# Patient Record
Sex: Male | Born: 1955 | Race: White | Hispanic: No | Marital: Married | Smoking: Never smoker
Health system: Southern US, Community
[De-identification: ages and names within clinical notes are randomized; demographics above are authoritative.]

## PROBLEM LIST (undated history)

## (undated) DIAGNOSIS — G8929 Other chronic pain: Secondary | ICD-10-CM

## (undated) DIAGNOSIS — M1990 Unspecified osteoarthritis, unspecified site: Secondary | ICD-10-CM

## (undated) DIAGNOSIS — M79673 Pain in unspecified foot: Secondary | ICD-10-CM

## (undated) HISTORY — PX: HERNIA REPAIR: SHX51

---

## 2013-06-29 ENCOUNTER — Other Ambulatory Visit: Payer: Self-pay | Admitting: Orthopedic Surgery

## 2013-06-29 DIAGNOSIS — M79671 Pain in right foot: Secondary | ICD-10-CM

## 2013-06-29 DIAGNOSIS — M79673 Pain in unspecified foot: Secondary | ICD-10-CM

## 2013-07-26 ENCOUNTER — Other Ambulatory Visit: Payer: Self-pay

## 2015-06-26 NOTE — H&P (Signed)
TOTAL HIP ADMISSION H&P  Patient is admitted for right total hip arthroplasty.  Subjective:  Chief Complaint: right hip pain  HPI: Minus Micheal Foster Level, 59 y.o. male, has a history of pain and functional disability in the right hip(s) due to arthritis and patient has failed non-surgical conservative treatments for greater than 12 weeks to include NSAID's and/or analgesics, corticosteriod injections, supervised PT with diminished ADL's post treatment, use of assistive devices and activity modification.  Onset of symptoms was gradual starting 2 years ago with gradually worsening course since that time.The patient noted no past surgery on the right hip(s).  Patient currently rates pain in the right hip at 9 out of 10 with activity. Patient has night pain, worsening of pain with activity and weight bearing, pain that interfers with activities of daily living and crepitus. Patient has evidence of joint space narrowing by imaging studies. This condition presents safety issues increasing the risk of falls.  There is no current active infection.  There are no active problems to display for this patient.  No past medical history on file.  No past surgical history on file.  No prescriptions prior to admission   Allergies not on file  Social History  Substance Use Topics  . Smoking status: Not on file  . Smokeless tobacco: Not on file  . Alcohol Use: Not on file    No family history on file.   Review of Systems  Constitutional: Negative for fever and chills.  HENT: Negative for congestion and sore throat.   Eyes: Negative for blurred vision.  Respiratory: Negative for shortness of breath and wheezing.   Cardiovascular: Negative for chest pain and palpitations.  Gastrointestinal: Negative for nausea, vomiting and abdominal pain.  Musculoskeletal:       Pain in the right hip with prolonged ambulation  Skin: Negative for rash.  Neurological: Negative for dizziness and loss of consciousness.   Psychiatric/Behavioral: Negative for depression and suicidal ideas.    Objective:  Physical Exam  Vitals reviewed. Constitutional: He is oriented to person, place, and time. He appears well-developed and well-nourished.  HENT:  Head: Normocephalic and atraumatic.  Eyes: Conjunctivae and EOM are normal. Pupils are equal, round, and reactive to light.  Neck: Normal range of motion. Neck supple.  Cardiovascular: Normal rate and intact distal pulses.   Respiratory: Effort normal and breath sounds normal.  GI: Soft. Bowel sounds are normal.  Musculoskeletal:  Pain with flexion of the right hip  Neurological: He is alert and oriented to person, place, and time.  Skin: Skin is warm and dry.  Psychiatric: He has a normal mood and affect. His behavior is normal. Judgment and thought content normal.    Vital signs in last 24 hours:    Labs:   There is no height or weight on file to calculate BMI.   Imaging Review Plain radiographs demonstrate severe degenerative joint disease of the right hip(s). The bone quality appears to be fair for age and reported activity level.  Assessment/Plan:  End stage arthritis, right hip(s)  The patient history, physical examination, clinical judgement of the provider and imaging studies are consistent with end stage degenerative joint disease of the right hip(s) and total hip arthroplasty is deemed medically necessary. The treatment options including medical management, injection therapy, arthroscopy and arthroplasty were discussed at length. The risks and benefits of total hip arthroplasty were presented and reviewed. The risks due to aseptic loosening, infection, stiffness, dislocation/subluxation,  thromboembolic complications and other imponderables were discussed.  The  patient acknowledged the explanation, agreed to proceed with the plan and consent was signed. Patient is being admitted for inpatient treatment for surgery, pain control, PT, OT,  prophylactic antibiotics, VTE prophylaxis, progressive ambulation and ADL's and discharge planning.The patient is planning to be discharged home with home health services

## 2015-07-07 ENCOUNTER — Encounter (HOSPITAL_COMMUNITY)
Admission: RE | Admit: 2015-07-07 | Discharge: 2015-07-07 | Disposition: A | Discharge status: Home / Self Care | Payer: BLUE CROSS/BLUE SHIELD | Source: Ambulatory Visit | Attending: Orthopedic Surgery | Admitting: Orthopedic Surgery

## 2015-07-07 ENCOUNTER — Encounter (HOSPITAL_COMMUNITY): Payer: Self-pay

## 2015-07-07 DIAGNOSIS — Z01812 Encounter for preprocedural laboratory examination: Secondary | ICD-10-CM | POA: Insufficient documentation

## 2015-07-07 DIAGNOSIS — Z0183 Encounter for blood typing: Secondary | ICD-10-CM | POA: Insufficient documentation

## 2015-07-07 DIAGNOSIS — M1611 Unilateral primary osteoarthritis, right hip: Secondary | ICD-10-CM | POA: Diagnosis not present

## 2015-07-07 HISTORY — DX: Other chronic pain: G89.29

## 2015-07-07 HISTORY — DX: Pain in unspecified foot: M79.673

## 2015-07-07 HISTORY — DX: Unspecified osteoarthritis, unspecified site: M19.90

## 2015-07-07 LAB — TYPE AND SCREEN
ABO/RH(D): A POS
Antibody Screen: NEGATIVE

## 2015-07-07 LAB — URINALYSIS, ROUTINE W REFLEX MICROSCOPIC
Bilirubin Urine: NEGATIVE
Glucose, UA: NEGATIVE mg/dL
Hgb urine dipstick: NEGATIVE
Ketones, ur: NEGATIVE mg/dL
Leukocytes, UA: NEGATIVE
Nitrite: NEGATIVE
Protein, ur: NEGATIVE mg/dL
Specific Gravity, Urine: 1.011 (ref 1.005–1.030)
Urobilinogen, UA: 0.2 mg/dL (ref 0.0–1.0)
pH: 6 (ref 5.0–8.0)

## 2015-07-07 LAB — CBC
HCT: 44 % (ref 39.0–52.0)
Hemoglobin: 14.6 g/dL (ref 13.0–17.0)
MCH: 31.7 pg (ref 26.0–34.0)
MCHC: 33.2 g/dL (ref 30.0–36.0)
MCV: 95.7 fL (ref 78.0–100.0)
Platelets: 182 10*3/uL (ref 150–400)
RBC: 4.6 MIL/uL (ref 4.22–5.81)
RDW: 13 % (ref 11.5–15.5)
WBC: 7.7 10*3/uL (ref 4.0–10.5)

## 2015-07-07 LAB — ABO/RH: ABO/RH(D): A POS

## 2015-07-07 LAB — BASIC METABOLIC PANEL
Anion gap: 9 (ref 5–15)
BUN: 13 mg/dL (ref 6–20)
CO2: 28 mmol/L (ref 22–32)
Calcium: 9 mg/dL (ref 8.9–10.3)
Chloride: 98 mmol/L — ABNORMAL LOW (ref 101–111)
Creatinine, Ser: 1.21 mg/dL (ref 0.61–1.24)
GFR calc Af Amer: >60 mL/min (ref >60)
GFR calc non Af Amer: >60 mL/min (ref >60)
Glucose, Bld: 91 mg/dL (ref 65–99)
Potassium: 3.6 mmol/L (ref 3.5–5.1)
Sodium: 135 mmol/L (ref 135–145)

## 2015-07-07 LAB — SURGICAL PCR SCREEN
MRSA, PCR: NEGATIVE
Staphylococcus aureus: NEGATIVE

## 2015-07-07 LAB — PROTIME-INR
INR: 1.05 (ref 0.00–1.49)
Prothrombin Time: 13.9 seconds (ref 11.6–15.2)

## 2015-07-07 NOTE — Pre-Procedure Instructions (Signed)
    Minus LibertyRobert Broner  07/07/2015      [facility] [address]- [place], [place] - [address] AT [facility] [address] [place] [id number] Phone: [phone] Fax: [phone]    Your procedure is scheduled on 07-18-2015   Tuesday   [facility] 10:00 [facility].M.   Call this number if you have problems the morning of surgery:  [phone]   Remember:  Do not eat food or drink liquids after midnight.   Take these medicines the morning of surgery with A SIP OF WATER pain medication if needed   Do not wear jewelry,   Do not wear lotions, powders, or perfumes.    Do not shave 48 hours prior to surgery.  Men may shave face and neck.   Do not bring valuables to the hospital.  [facility] is not responsible for any belongings or valuables.  Contacts, dentures or bridgework may not be worn into surgery.  Leave your suitcase in the car.  After surgery it may be brought to your room.  For patients admitted to the hospital, discharge time will be determined by your treatment team.     Special instructions:  See Attached Sheet "Preparing for Surgery" for instructions on CHG showers   Please read over the following fact sheets that you were given. Pain Booklet, Coughing and Deep Breathing and Surgical Site Infection Prevention

## 2015-07-17 MED ORDER — TRANEXAMIC ACID 1000 MG/10ML IV SOLN
1000.0000 mg | INTRAVENOUS | Status: AC
  Administered 2015-07-18: 1000 mg via INTRAVENOUS
  Filled 2015-07-17: qty 10

## 2015-07-17 MED ORDER — VANCOMYCIN HCL 10 G IV SOLR
1500.0000 mg | INTRAVENOUS | Status: AC
  Administered 2015-07-18: 1500 mg via INTRAVENOUS
  Filled 2015-07-17: qty 1500

## 2015-07-18 ENCOUNTER — Inpatient Hospital Stay (HOSPITAL_COMMUNITY): Payer: BLUE CROSS/BLUE SHIELD | Admitting: Anesthesiology

## 2015-07-18 ENCOUNTER — Inpatient Hospital Stay (HOSPITAL_COMMUNITY)
Admission: AD | Admit: 2015-07-18 | Discharge: 2015-07-19 | DRG: 470 | Disposition: A | Discharge status: Home Health | Payer: BLUE CROSS/BLUE SHIELD | Source: Ambulatory Visit | Attending: Orthopedic Surgery | Admitting: Orthopedic Surgery

## 2015-07-18 ENCOUNTER — Inpatient Hospital Stay (HOSPITAL_COMMUNITY): Payer: BLUE CROSS/BLUE SHIELD

## 2015-07-18 ENCOUNTER — Encounter (HOSPITAL_COMMUNITY)
Admission: AD | Disposition: A | Discharge status: Home Health | Payer: Self-pay | Source: Ambulatory Visit | Attending: Orthopedic Surgery

## 2015-07-18 ENCOUNTER — Encounter (HOSPITAL_COMMUNITY): Payer: Self-pay | Admitting: Anesthesiology

## 2015-07-18 DIAGNOSIS — M79673 Pain in unspecified foot: Secondary | ICD-10-CM | POA: Diagnosis present

## 2015-07-18 DIAGNOSIS — G8929 Other chronic pain: Secondary | ICD-10-CM | POA: Diagnosis present

## 2015-07-18 DIAGNOSIS — F1020 Alcohol dependence, uncomplicated: Secondary | ICD-10-CM | POA: Diagnosis present

## 2015-07-18 DIAGNOSIS — Z6836 Body mass index (BMI) 36.0-36.9, adult: Secondary | ICD-10-CM

## 2015-07-18 DIAGNOSIS — M1990 Unspecified osteoarthritis, unspecified site: Secondary | ICD-10-CM | POA: Diagnosis present

## 2015-07-18 DIAGNOSIS — Z419 Encounter for procedure for purposes other than remedying health state, unspecified: Secondary | ICD-10-CM

## 2015-07-18 DIAGNOSIS — M1611 Unilateral primary osteoarthritis, right hip: Secondary | ICD-10-CM | POA: Diagnosis present

## 2015-07-18 HISTORY — PX: TOTAL HIP ARTHROPLASTY: SHX124

## 2015-07-18 SURGERY — ARTHROPLASTY, HIP, TOTAL, ANTERIOR APPROACH
Anesthesia: General | Site: Hip | Laterality: Right

## 2015-07-18 MED ORDER — METHOCARBAMOL 500 MG PO TABS
500.0000 mg | ORAL_TABLET | Freq: Four times a day (QID) | ORAL | Status: DC | PRN
  Administered 2015-07-18 – 2015-07-19 (×2): 500 mg via ORAL
  Filled 2015-07-18 (×2): qty 1

## 2015-07-18 MED ORDER — LORAZEPAM 2 MG/ML IJ SOLN
1.0000 mg | Freq: Four times a day (QID) | INTRAMUSCULAR | Status: DC | PRN
  Administered 2015-07-18: 1 mg via INTRAVENOUS

## 2015-07-18 MED ORDER — ASPIRIN EC 325 MG PO TBEC
325.0000 mg | DELAYED_RELEASE_TABLET | Freq: Every day | ORAL | Status: DC
  Administered 2015-07-19: 325 mg via ORAL
  Filled 2015-07-18: qty 1

## 2015-07-18 MED ORDER — VITAMIN B-1 100 MG PO TABS
100.0000 mg | ORAL_TABLET | Freq: Every day | ORAL | Status: DC
  Administered 2015-07-19: 100 mg via ORAL
  Filled 2015-07-18: qty 1

## 2015-07-18 MED ORDER — CEFAZOLIN SODIUM-DEXTROSE 2-3 GM-% IV SOLR
INTRAVENOUS | Status: DC | PRN
  Administered 2015-07-18: 2 g via INTRAVENOUS

## 2015-07-18 MED ORDER — LIDOCAINE HCL (CARDIAC) 20 MG/ML IV SOLN
INTRAVENOUS | Status: DC | PRN
  Administered 2015-07-18: 70 mg via INTRAVENOUS

## 2015-07-18 MED ORDER — PROPOFOL 10 MG/ML IV BOLUS
INTRAVENOUS | Status: AC
  Filled 2015-07-18: qty 20

## 2015-07-18 MED ORDER — 0.9 % SODIUM CHLORIDE (POUR BTL) OPTIME
TOPICAL | Status: DC | PRN
  Administered 2015-07-18: 1000 mL

## 2015-07-18 MED ORDER — ROCURONIUM BROMIDE 100 MG/10ML IV SOLN
INTRAVENOUS | Status: DC | PRN
  Administered 2015-07-18: 30 mg via INTRAVENOUS
  Administered 2015-07-18 (×2): 10 mg via INTRAVENOUS

## 2015-07-18 MED ORDER — ONDANSETRON HCL 4 MG/2ML IJ SOLN
INTRAMUSCULAR | Status: DC | PRN
  Administered 2015-07-18: 4 mg via INTRAVENOUS

## 2015-07-18 MED ORDER — HYDROMORPHONE HCL 2 MG PO TABS
4.0000 mg | ORAL_TABLET | ORAL | Status: DC | PRN
  Administered 2015-07-18 – 2015-07-19 (×3): 4 mg via ORAL
  Filled 2015-07-18 (×3): qty 2

## 2015-07-18 MED ORDER — ALBUMIN HUMAN 5 % IV SOLN
INTRAVENOUS | Status: DC | PRN
  Administered 2015-07-18: 13:00:00 via INTRAVENOUS

## 2015-07-18 MED ORDER — MENTHOL 3 MG MT LOZG: 1.0000 | LOZENGE | OROMUCOSAL | Status: DC | PRN

## 2015-07-18 MED ORDER — SODIUM CHLORIDE 0.9 % IV SOLN: INTRAVENOUS | Status: DC | PRN

## 2015-07-18 MED ORDER — POLYETHYLENE GLYCOL 3350 17 G PO PACK: 17.0000 g | PACK | Freq: Every day | ORAL | Status: DC | PRN

## 2015-07-18 MED ORDER — ONDANSETRON HCL 4 MG/2ML IJ SOLN: 4.0000 mg | Freq: Four times a day (QID) | INTRAMUSCULAR | Status: DC | PRN

## 2015-07-18 MED ORDER — LORAZEPAM 1 MG PO TABS
1.0000 mg | ORAL_TABLET | Freq: Four times a day (QID) | ORAL | Status: DC | PRN
  Administered 2015-07-18: 1 mg via ORAL
  Filled 2015-07-18: qty 1

## 2015-07-18 MED ORDER — FENTANYL CITRATE (PF) 250 MCG/5ML IJ SOLN
INTRAMUSCULAR | Status: AC
  Filled 2015-07-18: qty 5

## 2015-07-18 MED ORDER — GLYCOPYRROLATE 0.2 MG/ML IJ SOLN
INTRAMUSCULAR | Status: DC | PRN
  Administered 2015-07-18: 0.6 mg via INTRAVENOUS

## 2015-07-18 MED ORDER — PHENYLEPHRINE 40 MCG/ML (10ML) SYRINGE FOR IV PUSH (FOR BLOOD PRESSURE SUPPORT)
PREFILLED_SYRINGE | INTRAVENOUS | Status: AC
  Filled 2015-07-18: qty 10

## 2015-07-18 MED ORDER — ARTIFICIAL TEARS OP OINT
TOPICAL_OINTMENT | OPHTHALMIC | Status: AC
  Filled 2015-07-18: qty 3.5

## 2015-07-18 MED ORDER — ASPIRIN 325 MG PO TABS: 325.0000 mg | ORAL_TABLET | Freq: Every day | ORAL | Status: DC

## 2015-07-18 MED ORDER — KETOROLAC TROMETHAMINE 30 MG/ML IJ SOLN
INTRAMUSCULAR | Status: AC
  Filled 2015-07-18: qty 1

## 2015-07-18 MED ORDER — FENTANYL CITRATE (PF) 100 MCG/2ML IJ SOLN
INTRAMUSCULAR | Status: DC | PRN
  Administered 2015-07-18: 100 ug via INTRAVENOUS
  Administered 2015-07-18: 50 ug via INTRAVENOUS
  Administered 2015-07-18: 100 ug via INTRAVENOUS
  Administered 2015-07-18: 50 ug via INTRAVENOUS
  Administered 2015-07-18: 100 ug via INTRAVENOUS
  Administered 2015-07-18 (×2): 50 ug via INTRAVENOUS

## 2015-07-18 MED ORDER — CELECOXIB 200 MG PO CAPS
200.0000 mg | ORAL_CAPSULE | Freq: Two times a day (BID) | ORAL | Status: DC
  Administered 2015-07-18 – 2015-07-19 (×2): 200 mg via ORAL
  Filled 2015-07-18 (×2): qty 1

## 2015-07-18 MED ORDER — SODIUM CHLORIDE 0.9 % IR SOLN
Status: DC | PRN
  Administered 2015-07-18: 3000 mL

## 2015-07-18 MED ORDER — ACETAMINOPHEN 500 MG PO TABS
ORAL_TABLET | ORAL | Status: AC
  Filled 2015-07-18: qty 2

## 2015-07-18 MED ORDER — NEOSTIGMINE METHYLSULFATE 10 MG/10ML IV SOLN
INTRAVENOUS | Status: DC | PRN
  Administered 2015-07-18: 5 mg via INTRAVENOUS

## 2015-07-18 MED ORDER — FOLIC ACID 1 MG PO TABS
1.0000 mg | ORAL_TABLET | Freq: Every day | ORAL | Status: DC
  Administered 2015-07-19: 1 mg via ORAL
  Filled 2015-07-18: qty 1

## 2015-07-18 MED ORDER — DEXAMETHASONE SODIUM PHOSPHATE 10 MG/ML IJ SOLN
10.0000 mg | Freq: Once | INTRAMUSCULAR | Status: AC
  Administered 2015-07-19: 10 mg via INTRAVENOUS
  Filled 2015-07-18: qty 1

## 2015-07-18 MED ORDER — LORAZEPAM 1 MG PO TABS
0.0000 mg | ORAL_TABLET | Freq: Two times a day (BID) | ORAL | Status: DC
  Administered 2015-07-19: 2 mg via ORAL

## 2015-07-18 MED ORDER — PHENYLEPHRINE HCL 10 MG/ML IJ SOLN
INTRAMUSCULAR | Status: DC | PRN
  Administered 2015-07-18 (×3): 120 ug via INTRAVENOUS

## 2015-07-18 MED ORDER — ONDANSETRON HCL 4 MG PO TABS: 4.0000 mg | ORAL_TABLET | Freq: Three times a day (TID) | ORAL | Status: AC | PRN

## 2015-07-18 MED ORDER — PROPOFOL 10 MG/ML IV BOLUS
INTRAVENOUS | Status: DC | PRN
  Administered 2015-07-18: 200 mg via INTRAVENOUS

## 2015-07-18 MED ORDER — MIDAZOLAM HCL 5 MG/5ML IJ SOLN
INTRAMUSCULAR | Status: DC | PRN
  Administered 2015-07-18: 2 mg via INTRAVENOUS

## 2015-07-18 MED ORDER — POTASSIUM CHLORIDE IN NACL 20-0.45 MEQ/L-% IV SOLN
INTRAVENOUS | Status: DC
  Filled 2015-07-18 (×4): qty 1000

## 2015-07-18 MED ORDER — DOCUSATE SODIUM 100 MG PO CAPS
100.0000 mg | ORAL_CAPSULE | Freq: Two times a day (BID) | ORAL | Status: DC
  Administered 2015-07-18 – 2015-07-19 (×2): 100 mg via ORAL
  Filled 2015-07-18 (×2): qty 1

## 2015-07-18 MED ORDER — DOCUSATE SODIUM 100 MG PO CAPS: 100.0000 mg | ORAL_CAPSULE | Freq: Two times a day (BID) | ORAL | Status: DC

## 2015-07-18 MED ORDER — CHLORHEXIDINE GLUCONATE 4 % EX LIQD: 60.0000 mL | Freq: Once | CUTANEOUS | Status: DC

## 2015-07-18 MED ORDER — HYDROMORPHONE HCL 1 MG/ML IJ SOLN
0.2500 mg | INTRAMUSCULAR | Status: DC | PRN
  Administered 2015-07-18 (×4): 0.5 mg via INTRAVENOUS

## 2015-07-18 MED ORDER — LIDOCAINE HCL (CARDIAC) 20 MG/ML IV SOLN
INTRAVENOUS | Status: AC
  Filled 2015-07-18: qty 5

## 2015-07-18 MED ORDER — ACETAMINOPHEN 10 MG/ML IV SOLN
1000.0000 mg | INTRAVENOUS | Status: AC
  Administered 2015-07-18: 1000 mg via INTRAVENOUS
  Filled 2015-07-18: qty 100

## 2015-07-18 MED ORDER — ACETAMINOPHEN 10 MG/ML IV SOLN
INTRAVENOUS | Status: AC
  Filled 2015-07-18: qty 100

## 2015-07-18 MED ORDER — CEFAZOLIN SODIUM-DEXTROSE 2-3 GM-% IV SOLR
2.0000 g | Freq: Four times a day (QID) | INTRAVENOUS | Status: AC
  Administered 2015-07-19: 2 g via INTRAVENOUS
  Filled 2015-07-18 (×2): qty 50

## 2015-07-18 MED ORDER — MIDAZOLAM HCL 2 MG/2ML IJ SOLN
INTRAMUSCULAR | Status: AC
  Filled 2015-07-18: qty 4

## 2015-07-18 MED ORDER — LISINOPRIL 20 MG PO TABS
20.0000 mg | ORAL_TABLET | Freq: Every day | ORAL | Status: DC
  Administered 2015-07-19: 20 mg via ORAL
  Filled 2015-07-18: qty 1

## 2015-07-18 MED ORDER — METOCLOPRAMIDE HCL 5 MG PO TABS: 5.0000 mg | ORAL_TABLET | Freq: Three times a day (TID) | ORAL | Status: DC | PRN

## 2015-07-18 MED ORDER — ONDANSETRON HCL 4 MG PO TABS: 4.0000 mg | ORAL_TABLET | Freq: Four times a day (QID) | ORAL | Status: DC | PRN

## 2015-07-18 MED ORDER — LORAZEPAM 1 MG PO TABS
0.0000 mg | ORAL_TABLET | Freq: Four times a day (QID) | ORAL | Status: DC
  Administered 2015-07-19: 1 mg via ORAL
  Filled 2015-07-18: qty 2

## 2015-07-18 MED ORDER — PHENYLEPHRINE HCL 10 MG/ML IJ SOLN
10.0000 mg | INTRAMUSCULAR | Status: DC | PRN
  Administered 2015-07-18: 100 ug/min via INTRAVENOUS

## 2015-07-18 MED ORDER — HYDROMORPHONE HCL 1 MG/ML IJ SOLN
0.5000 mg | INTRAMUSCULAR | Status: AC | PRN
  Administered 2015-07-18 (×2): 0.5 mg via INTRAVENOUS

## 2015-07-18 MED ORDER — LACTATED RINGERS IV SOLN
INTRAVENOUS | Status: DC
  Administered 2015-07-18: 10:00:00 via INTRAVENOUS

## 2015-07-18 MED ORDER — BUPIVACAINE LIPOSOME 1.3 % IJ SUSP
20.0000 mL | Freq: Once | INTRAMUSCULAR | Status: DC
  Filled 2015-07-18: qty 20

## 2015-07-18 MED ORDER — LORAZEPAM 2 MG/ML IJ SOLN
INTRAMUSCULAR | Status: AC
  Filled 2015-07-18: qty 1

## 2015-07-18 MED ORDER — ACETAMINOPHEN 10 MG/ML IV SOLN: 1000.0000 mg | Freq: Once | INTRAVENOUS | Status: DC

## 2015-07-18 MED ORDER — HYDROMORPHONE HCL 1 MG/ML IJ SOLN
INTRAMUSCULAR | Status: AC
  Filled 2015-07-18: qty 1

## 2015-07-18 MED ORDER — SUCCINYLCHOLINE CHLORIDE 20 MG/ML IJ SOLN
INTRAMUSCULAR | Status: DC | PRN
  Administered 2015-07-18: 120 mg via INTRAVENOUS

## 2015-07-18 MED ORDER — PROMETHAZINE HCL 25 MG/ML IJ SOLN
INTRAMUSCULAR | Status: AC
Start: 2015-07-18 — End: 2015-07-19
  Filled 2015-07-18: qty 1

## 2015-07-18 MED ORDER — SODIUM CHLORIDE 0.9 % IJ SOLN
INTRAMUSCULAR | Status: AC
  Filled 2015-07-18: qty 20

## 2015-07-18 MED ORDER — HYDROCHLOROTHIAZIDE 25 MG PO TABS
25.0000 mg | ORAL_TABLET | Freq: Every day | ORAL | Status: DC
  Administered 2015-07-19: 25 mg via ORAL
  Filled 2015-07-18: qty 1

## 2015-07-18 MED ORDER — HYDROCODONE-ACETAMINOPHEN 5-325 MG PO TABS
2.0000 | ORAL_TABLET | ORAL | Status: DC | PRN
  Administered 2015-07-18 – 2015-07-19 (×3): 2 via ORAL
  Filled 2015-07-18 (×3): qty 2

## 2015-07-18 MED ORDER — SODIUM CHLORIDE 0.9 % IV SOLN
INTRAVENOUS | Status: DC | PRN
  Administered 2015-07-18: 12:00:00 via INTRAVENOUS

## 2015-07-18 MED ORDER — METOCLOPRAMIDE HCL 5 MG/ML IJ SOLN: 5.0000 mg | Freq: Three times a day (TID) | INTRAMUSCULAR | Status: DC | PRN

## 2015-07-18 MED ORDER — HYDROMORPHONE HCL 1 MG/ML IJ SOLN
INTRAMUSCULAR | Status: AC
  Filled 2015-07-18: qty 2

## 2015-07-18 MED ORDER — LACTATED RINGERS IV SOLN
INTRAVENOUS | Status: DC | PRN
  Administered 2015-07-18 (×2): via INTRAVENOUS

## 2015-07-18 MED ORDER — ACETAMINOPHEN 500 MG PO TABS
1000.0000 mg | ORAL_TABLET | Freq: Once | ORAL | Status: AC
  Administered 2015-07-18: 1000 mg via ORAL

## 2015-07-18 MED ORDER — THIAMINE HCL 100 MG/ML IJ SOLN
100.0000 mg | Freq: Every day | INTRAMUSCULAR | Status: DC
  Filled 2015-07-18: qty 2

## 2015-07-18 MED ORDER — POTASSIUM CHLORIDE IN NACL 20-0.45 MEQ/L-% IV SOLN: INTRAVENOUS | Status: DC

## 2015-07-18 MED ORDER — ARTIFICIAL TEARS OP OINT
TOPICAL_OINTMENT | OPHTHALMIC | Status: DC | PRN
  Administered 2015-07-18: 1 via OPHTHALMIC

## 2015-07-18 MED ORDER — ROCURONIUM BROMIDE 50 MG/5ML IV SOLN
INTRAVENOUS | Status: AC
  Filled 2015-07-18: qty 1

## 2015-07-18 MED ORDER — METHOCARBAMOL 1000 MG/10ML IJ SOLN
500.0000 mg | Freq: Four times a day (QID) | INTRAVENOUS | Status: DC | PRN
  Administered 2015-07-18: 500 mg via INTRAVENOUS
  Filled 2015-07-18 (×2): qty 5

## 2015-07-18 MED ORDER — PROMETHAZINE HCL 25 MG/ML IJ SOLN
6.2500 mg | INTRAMUSCULAR | Status: DC | PRN
  Administered 2015-07-18: 12.5 mg via INTRAVENOUS

## 2015-07-18 MED ORDER — INFLUENZA VAC SPLIT QUAD 0.5 ML IM SUSY
0.5000 mL | PREFILLED_SYRINGE | INTRAMUSCULAR | Status: AC
  Administered 2015-07-19: 0.5 mL via INTRAMUSCULAR
  Filled 2015-07-18: qty 0.5

## 2015-07-18 MED ORDER — ADULT MULTIVITAMIN W/MINERALS CH
1.0000 | ORAL_TABLET | Freq: Every day | ORAL | Status: DC
  Administered 2015-07-19: 1 via ORAL
  Filled 2015-07-18: qty 1

## 2015-07-18 MED ORDER — LISINOPRIL-HYDROCHLOROTHIAZIDE 20-25 MG PO TABS: 1.0000 | ORAL_TABLET | Freq: Every day | ORAL | Status: DC

## 2015-07-18 MED ORDER — PHENOL 1.4 % MT LIQD: 1.0000 | OROMUCOSAL | Status: DC | PRN

## 2015-07-18 MED ORDER — KETOROLAC TROMETHAMINE 30 MG/ML IJ SOLN
30.0000 mg | Freq: Once | INTRAMUSCULAR | Status: AC
  Administered 2015-07-18: 30 mg via INTRAVENOUS

## 2015-07-18 MED ORDER — METHOCARBAMOL 500 MG PO TABS: 500.0000 mg | ORAL_TABLET | Freq: Four times a day (QID) | ORAL | Status: DC

## 2015-07-18 SURGICAL SUPPLY — 53 items
BLADE SAW SGTL 18X1.27X75 (BLADE) ×2 IMPLANT
CAPT HIP TOTAL 2 ×2 IMPLANT
CLSR STERI-STRIP ANTIMIC 1/2X4 (GAUZE/BANDAGES/DRESSINGS) ×2 IMPLANT
COVER SURGICAL LIGHT HANDLE (MISCELLANEOUS) ×2 IMPLANT
DRAPE C-ARM 42X72 X-RAY (DRAPES) ×2 IMPLANT
DRAPE INCISE IOBAN 66X45 STRL (DRAPES) ×2 IMPLANT
DRAPE ORTHO SPLIT 77X108 STRL (DRAPES) ×2
DRAPE STERI IOBAN 125X83 (DRAPES) ×4 IMPLANT
DRAPE SURG ORHT 6 SPLT 77X108 (DRAPES) ×2 IMPLANT
DRAPE U-SHAPE 47X51 STRL (DRAPES) ×2 IMPLANT
DRSG MEPILEX BORDER 4X8 (GAUZE/BANDAGES/DRESSINGS) ×2 IMPLANT
DURAPREP 26ML APPLICATOR (WOUND CARE) ×2 IMPLANT
ELECT BLADE 4.0 EZ CLEAN MEGAD (MISCELLANEOUS) ×2
ELECT REM PT RETURN 9FT ADLT (ELECTROSURGICAL) ×2
ELECTRODE BLDE 4.0 EZ CLN MEGD (MISCELLANEOUS) ×1 IMPLANT
ELECTRODE REM PT RTRN 9FT ADLT (ELECTROSURGICAL) ×1 IMPLANT
FACESHIELD WRAPAROUND (MASK) ×4 IMPLANT
GLOVE BIO SURGEON STRL SZ7 (GLOVE) ×2 IMPLANT
GLOVE BIO SURGEON STRL SZ7.5 (GLOVE) ×2 IMPLANT
GLOVE BIOGEL PI IND STRL 7.0 (GLOVE) ×2 IMPLANT
GLOVE BIOGEL PI IND STRL 8 (GLOVE) ×1 IMPLANT
GLOVE BIOGEL PI INDICATOR 7.0 (GLOVE) ×2
GLOVE BIOGEL PI INDICATOR 8 (GLOVE) ×1
GLOVE SURG SS PI 6.5 STRL IVOR (GLOVE) ×2 IMPLANT
GLOVE SURG SS PI 7.0 STRL IVOR (GLOVE) ×2 IMPLANT
GOWN STRL REUS W/ TWL LRG LVL3 (GOWN DISPOSABLE) ×2 IMPLANT
GOWN STRL REUS W/ TWL XL LVL3 (GOWN DISPOSABLE) ×2 IMPLANT
GOWN STRL REUS W/TWL LRG LVL3 (GOWN DISPOSABLE) ×2
GOWN STRL REUS W/TWL XL LVL3 (GOWN DISPOSABLE) ×2
KIT BASIN OR (CUSTOM PROCEDURE TRAY) ×2 IMPLANT
KIT ROOM TURNOVER OR (KITS) ×2 IMPLANT
MANIFOLD NEPTUNE II (INSTRUMENTS) ×2 IMPLANT
NDL SAFETY ECLIPSE 18X1.5 (NEEDLE) ×1 IMPLANT
NEEDLE 18GX1X1/2 (RX/OR ONLY) (NEEDLE) IMPLANT
NEEDLE HYPO 18GX1.5 SHARP (NEEDLE) ×1
NS IRRIG 1000ML POUR BTL (IV SOLUTION) ×2 IMPLANT
PACK TOTAL JOINT (CUSTOM PROCEDURE TRAY) ×2 IMPLANT
PACK UNIVERSAL I (CUSTOM PROCEDURE TRAY) ×2 IMPLANT
PAD ARMBOARD 7.5X6 YLW CONV (MISCELLANEOUS) ×2 IMPLANT
SPONGE LAP 18X18 X RAY DECT (DISPOSABLE) IMPLANT
STRIP CLOSURE SKIN 1/2X4 (GAUZE/BANDAGES/DRESSINGS) ×2 IMPLANT
SUT MNCRL AB 4-0 PS2 18 (SUTURE) ×2 IMPLANT
SUT MON AB 2-0 CT1 36 (SUTURE) ×4 IMPLANT
SUT VIC AB 0 CT1 27 (SUTURE) ×1
SUT VIC AB 0 CT1 27XBRD ANBCTR (SUTURE) ×1 IMPLANT
SUT VIC AB 1 CT1 27 (SUTURE) ×1
SUT VIC AB 1 CT1 27XBRD ANBCTR (SUTURE) ×1 IMPLANT
SYR 50ML LL SCALE MARK (SYRINGE) ×2 IMPLANT
SYRINGE 20CC LL (MISCELLANEOUS) IMPLANT
TOWEL OR 17X24 6PK STRL BLUE (TOWEL DISPOSABLE) ×2 IMPLANT
TOWEL OR 17X26 10 PK STRL BLUE (TOWEL DISPOSABLE) ×2 IMPLANT
WATER STERILE IRR 1000ML POUR (IV SOLUTION) ×2 IMPLANT
YANKAUER SUCT BULB TIP NO VENT (SUCTIONS) ×2 IMPLANT

## 2015-07-18 NOTE — Anesthesia Postprocedure Evaluation (Signed)
  Anesthesia Post-op Note  Patient: Minus LibertyRobert Hoeffner  Procedure(s) Performed: Procedure(s): TOTAL HIP ARTHROPLASTY ANTERIOR APPROACH (Right)  Patient Location: PACU  Anesthesia Type: General   Level of Consciousness: awake, alert  and oriented  Airway and Oxygen Therapy: Patient Spontanous Breathing  Post-op Pain: mild  Post-op Assessment: Post-op Vital signs reviewed  Post-op Vital Signs: Reviewed  Last Vitals:  Filed Vitals:   07/18/15 1820  BP: 145/72  Pulse: 83  Temp: 36.7 C  Resp: 16    Complications: No apparent anesthesia complications

## 2015-07-18 NOTE — Transfer of Care (Signed)
Immediate Anesthesia Transfer of Care Note  Patient: Micheal Foster  Procedure(s) Performed: Procedure(s): TOTAL HIP ARTHROPLASTY ANTERIOR APPROACH (Right)  Patient Location: PACU  Anesthesia Type:General  Level of Consciousness: awake, oriented, sedated, patient cooperative and responds to stimulation  Airway & Oxygen Therapy: Patient Spontanous Breathing and Patient connected to nasal cannula oxygen  Post-op Assessment: Report given to RN, Post -op Vital signs reviewed and stable, Patient moving all extremities and Patient moving all extremities X 4  Post vital signs: Reviewed and stable  Last Vitals:  Filed Vitals:   07/18/15 1443  BP:   Pulse: 68  Temp: 36.1 C  Resp: 18    Complications: No apparent anesthesia complications

## 2015-07-18 NOTE — Op Note (Signed)
07/18/2015  2:19 PM  PATIENT:  Micheal Foster   MRN: [id number]  PRE-OPERATIVE DIAGNOSIS:  OA, AVN RIGHT HIP  POST-OPERATIVE DIAGNOSIS:  OA, AVN RIGHT HIP  PROCEDURE:  Procedure(s): TOTAL HIP ARTHROPLASTY ANTERIOR APPROACH  PREOPERATIVE INDICATIONS:    Micheal Foster is an 59 y.o. male who has a diagnosis of <principal problem not specified> and elected for surgical management after failing conservative treatment.  The risks benefits and alternatives were discussed with the patient including but not limited to the risks of nonoperative treatment, versus surgical intervention including infection, bleeding, nerve injury, periprosthetic fracture, the need for revision surgery, dislocation, leg length discrepancy, blood clots, cardiopulmonary complications, morbidity, mortality, among others, and they were willing to proceed.     OPERATIVE REPORT     SURGEON:   Micheal Foster, Micheal Amble, MD    ASSISTANT:  Micheal Calender, PA-C, Micheal Foster was present and scrubbed throughout the case, critical for completion in a timely fashion, and for retraction, instrumentation, and closure.     ANESTHESIA:  General    COMPLICATIONS:  None.     COMPONENTS:  Stryker acolade fit femur size 5 with a 36 mm +-2.5 head ball and a PSL acetabular shell size 52 with a  polyethylene liner    PROCEDURE IN DETAIL:   The patient was met in the holding area and  identified.  The appropriate hip was identified and marked at the operative site.  The patient was then transported to the OR  and  placed under general anesthesia.  At that point, the patient was  placed in the supine position and  secured to the operating room table and all bony prominences padded. He received pre-operative antibiotics    The operative lower extremity was prepped from the iliac crest to the distal leg.  Sterile draping was performed.  Time out was performed prior to incision.      Skin incision was made just 2 cm lateral to the ASIS  extending in line  with the tensor fascia lata. Electrocautery was used to control all bleeders. I dissected down sharply to the fascia of the tensor fascia lata was confirmed that the muscle fibers beneath were running posteriorly. I then incised the fascia over the superficial tensor fascia lata in line with the incision. The fascia was elevated off the anterior aspect of the muscle the muscle was retracted posteriorly and protected throughout the case. I then used electrocautery to incise the tensor fascia lata fascia control and all bleeders. Immediately visible was the fat over top of the anterior neck and capsule.  I removed the anterior fat from the capsule and elevated the rectus muscle off of the anterior capsule. I then removed a large time of capsule. The retractors were then placed over the anterior acetabulum as well as around the superior and inferior neck.  I then removed a section of the femoral neck and a napkin ring fashion. Then used the power course to remove the femoral head from the acetabulum and thoroughly irrigated the acetabulum. I sized the femoral head.    I then exposed the deep acetabulum, cleared out any tissue including the ligamentum teres.   After adequate visualization, I excised the labrum, and then sequentially reamed.  I placed the trial acetabulum, which seated nicely, and then impacted the real cup into place.  Appropriate version and inclination was confirmed clinically matching their bony anatomy, and also with the use transverse acetabular ligament.  I placed a 42m screw in the posterior/superio position  with an excellent bite.    I then placed the polyethylene liner in place  I then abducted the leg and released the external rotators from the posterior femur allowing it to be easily delivered up lateral and anterior to the acetabulum for preparation of the femoral canal.    I then prepared the proximal femur using the cookie-cutter and then sequentially reamed and  broached.  A trial broach, neck, and head was utilized, and I reduced the hip and it was found to have excellent stability with functional range of motion..  I then impacted the real femoral prosthesis into place into the appropriate version, slightly anteverted to the normal anatomy, and I impacted the real head ball into place. The hip was then reduced and taken through functional range of motion and found to have excellent stability. Leg lengths were restored.  I then irrigated the hip copiously again with, and repaired the fascia with Vicryl, followed by monocryl for the subcutaneous tissue, Monocryl for the skin, Steri-Strips and sterile gauze. The patient was then awakened and returned to PACU in stable and satisfactory condition. There were no complications.  POST OPERATIVE PLAN: WBAT, DVT px: SCD's/TED and ASA 325  Micheal Lynch, MD Orthopedic Surgeon [phone]   This note was generated using a template and dragon dictation system. In light of that, I have reviewed the note and all aspects of it are applicable to this case. Any dictation errors are due to the computerized dictation system.

## 2015-07-18 NOTE — Anesthesia Preprocedure Evaluation (Addendum)
Anesthesia Evaluation  Patient identified by MRN, date of birth, ID band Patient awake    Reviewed: Allergy & Precautions, NPO status , Patient's Chart, lab work & pertinent test results  Airway Mallampati: II  TM Distance: <3 FB Neck ROM: Full    Dental no notable dental hx.    Pulmonary neg pulmonary ROS,    Pulmonary exam normal breath sounds clear to auscultation       Cardiovascular negative cardio ROS Normal cardiovascular exam Rhythm:Regular Rate:Normal     Neuro/Psych negative neurological ROS  negative psych ROS   GI/Hepatic negative GI ROS, (+)     substance abuse  alcohol use, Chronic narcotic use   Endo/Other  Morbid obesity  Renal/GU negative Renal ROS  negative genitourinary   Musculoskeletal negative musculoskeletal ROS (+)   Abdominal   Peds negative pediatric ROS (+)  Hematology negative hematology ROS (+)   Anesthesia Other Findings   Reproductive/Obstetrics negative OB ROS                            Anesthesia Physical Anesthesia Plan  ASA: III  Anesthesia Plan: General   Post-op Pain Management:    Induction: Intravenous  Airway Management Planned: Oral ETT  Additional Equipment:   Intra-op Plan:   Post-operative Plan: Extubation in OR  Informed Consent: I have reviewed the patients History and Physical, chart, labs and discussed the procedure including the risks, benefits and alternatives for the proposed anesthesia with the patient or authorized representative who has indicated his/her understanding and acceptance.   Dental advisory given  Plan Discussed with: CRNA and Surgeon  Anesthesia Plan Comments:        Anesthesia Quick Evaluation

## 2015-07-18 NOTE — Interval H&P Note (Signed)
History and Physical Interval Note:  07/18/2015 11:42 AM  Micheal Foster  has presented today for surgery, with the diagnosis of OA, AVN RIGHT HIP  The various methods of treatment have been discussed with the patient and family. After consideration of risks, benefits and other options for treatment, the patient has consented to  Procedure(s): TOTAL HIP ARTHROPLASTY ANTERIOR APPROACH (Right) as a surgical intervention .  The patient's history has been reviewed, patient examined, no change in status, stable for surgery.  I have reviewed the patient's chart and labs.  Questions were answered to the patient's satisfaction.     Sheelah Ritacco D

## 2015-07-18 NOTE — Anesthesia Procedure Notes (Signed)
Procedure Name: Intubation Date/Time: 07/18/2015 12:10 PM Performed by: Wray KearnsFOLEY, Areeb Corron A Pre-anesthesia Checklist: Patient identified, Emergency Drugs available, Timeout performed, Suction available and Patient being monitored Patient Re-evaluated:Patient Re-evaluated prior to inductionOxygen Delivery Method: Circle system utilized Preoxygenation: Pre-oxygenation with 100% oxygen Intubation Type: IV induction and Cricoid Pressure applied Ventilation: Mask ventilation without difficulty Laryngoscope Size: Mac and 4 Grade View: Grade I Tube type: Oral Tube size: 7.5 mm Number of attempts: 1 Airway Equipment and Method: Stylet Placement Confirmation: ETT inserted through vocal cords under direct vision,  breath sounds checked- equal and bilateral and positive ETCO2 Secured at: 23 cm Tube secured with: Tape Dental Injury: Teeth and Oropharynx as per pre-operative assessment

## 2015-07-18 NOTE — Discharge Instructions (Signed)

## 2015-07-19 ENCOUNTER — Encounter (HOSPITAL_COMMUNITY): Payer: Self-pay | Admitting: Orthopedic Surgery

## 2015-07-19 NOTE — Progress Notes (Signed)
Occupational Therapy Evaluation Patient Details Name: Micheal Foster MRN: [id number] DOB: Sep 20, 1955 Today's Date: 07/19/2015    History of Present Illness Pt presents for right direct anterior THA with PMH of OA, ETOH abuse, chronic foot pain   Clinical Impression   Pt admitted with the above diagnoses and presents with below problem list. Pt will benefit from continued acute OT to address the below listed deficits and maximize independence with BADLs prior to d/c home. PTA pt was independent with ADLs. Pt is currently min guard with ADLs, min A for bed mobility. Session details below. OT to continue to follow acutely.     Follow Up Recommendations  Supervision - Intermittent;Other (comment);No OT follow up (OOB/mobility)    Equipment Recommendations  Other (comment) (pt's recommended 3n1 has been delivered. )    Recommendations for Other Services       Precautions / Restrictions Precautions Precautions: None Restrictions Weight Bearing Restrictions: Yes RLE Weight Bearing: Weight bearing as tolerated      Mobility Bed Mobility Overal bed mobility: Needs Assistance Bed Mobility: Supine to Sit     Supine to sit: Min assist     General bed mobility comments: Assist for power up coming out of bed on pt's right side. Discussed coming out on left side next time with pt expressing concern about advancing LLE across bed. HOB elevated, rails used  Transfers Overall transfer level: Needs assistance Equipment used: Rolling walker (2 wheeled) Transfers: Sit to/from Stand Sit to Stand: Supervision         General transfer comment: from EOB and 3n1 cues for technique with rw    Balance Overall balance assessment: Needs assistance Sitting-balance support: No upper extremity supported Sitting balance-Leahy Scale: Normal     Standing balance support: Bilateral upper extremity supported;During functional activity Standing balance-Leahy Scale: Fair Standing balance  comment: hand on wall for support to stand to urinate                            ADL Overall ADL's : Needs assistance/impaired Eating/Feeding: Set up;Sitting   Grooming: Set up;Sitting   Upper Body Bathing: Set up;Sitting   Lower Body Bathing: Min guard;With adaptive equipment;Sit to/from stand   Upper Body Dressing : Set up;Sitting   Lower Body Dressing: Min guard;With adaptive equipment;Sit to/from stand   Toilet Transfer: Min guard;Ambulation;BSC;RW   Toileting- Architect and Hygiene: Min guard;Sit to/from stand   Tub/ Shower Transfer: Tub transfer;Min guard;Ambulation;Tub bench;Rolling walker Tub/Shower Transfer Details (indicate cue type and reason): Discussed shower transfer options and recommendations Functional mobility during ADLs: Min guard;Rolling walker General ADL Comments: Pt completed toilet transfer to 3n1 and grooming at sink in standing position as detailed aboe. Spouse present for session. Educated on AE, DME and strategies for LB ADLs and toilet/shower transfers. Discussed safety with setup at home (bag on rw, have someone restrain pets when OOB/mobilizing).     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  RLE: Unable to fully assess due to pain   Cervical / Trunk Assessment Cervical / Trunk Assessment: Normal   Communication Communication Communication: No difficulties   Cognition  Arousal/Alertness: Awake/alert Behavior During Therapy: WFL for tasks assessed/performed Overall Cognitive Status: Within Functional Limits for tasks assessed                     General Comments       Exercises Exercises: Total Joint     Shoulder Instructions      Home Living Family/patient expects to be discharged to:: Private residence Living Arrangements: Spouse/significant other Available Help at Discharge: Family;Friend(s);Available PRN/intermittently Type of Home: House Home Access: Stairs to enter Entergy CorporationEntrance Stairs-Number of Steps: 4 Entrance Stairs-Rails: None Home Layout: Two level;1/2 bath on main level Alternate Level Stairs-Number of Steps: flight Alternate Level Stairs-Rails: Right;Left;Can reach both Bathroom Shower/Tub: Producer, television/film/videoWalk-in shower   Bathroom Toilet: Standard Bathroom Accessibility: Yes How Accessible: Accessible via walker Home Equipment: Walker - 4 wheels;Shower seat   Additional Comments: pt's wife will be back at work tomorrow but friends and familt will be checking on him. His bedroom is on main level but shower is upstairs (his bedroom has tub only). Pt has tub bench he uses downstairs. Has walkin shower upstairs.      Prior Functioning/Environment Level of Independence: Independent        Comments: works for [employer], climbs telephone poles, crawls under houses    OT Diagnosis: Acute pain   OT Problem List: Impaired balance (sitting and/or standing);Decreased knowledge of use of DME or AE;Decreased knowledge of precautions;Pain   OT Treatment/Interventions: Self-care/ADL training;DME and/or AE instruction;Therapeutic activities;Patient/family education;Balance training    OT Goals(Current goals can be found in the care plan section) Acute Rehab OT Goals Patient Stated Goal: return home OT Goal Formulation: With patient/family Time For Goal Achievement: 07/26/15 Potential to Achieve Goals: Good ADL Goals Pt Will Perform Lower Body  Dressing: with modified independence;with adaptive equipment;sit to/from stand Pt Will Perform Tub/Shower Transfer: Tub transfer;tub bench;with supervision;rolling walker Additional ADL Goal #1: Pt will complete bed mobility at mod I level to prepare for OOB ADLs.   OT Frequency: Min 2X/week   Barriers to D/C:            Co-evaluation              End of Session Equipment Utilized During Treatment: Gait belt;Rolling walker Nurse Communication: Patient requests pain meds  Activity Tolerance: Patient tolerated treatment well Patient left: in bed;with call bell/phone within reach;with family/visitor present   Time: [id number] OT Time Calculation (min): 23 min Charges:  OT General Charges $OT Visit: 1 Procedure OT Evaluation $Initial OT Evaluation Tier I: 1 Procedure OT Treatments $Self Care/Home Management : 8-22 mins G-Codes:    Pilar GrammesMathews, Micheal Foster 07/19/2015, 12:29 PM

## 2015-07-19 NOTE — Progress Notes (Signed)
Physical Therapy Treatment Patient Details Name: Micheal Foster MRN: [id number] DOB: [birth year] Today's Date: 07/19/2015    History of Present Illness Pt presents for right direct anterior THA with PMH of OA, ETOH abuse, chronic foot pain    PT Comments    Pt progressing well, practiced steps bkwds with wife and was able to perform with min A. Gait distance progressing. Pt prefers to use 4 wheel RW at home because feels that std RW too narrow. PT will continue to follow.    Follow Up Recommendations  Home health PT;Supervision - Intermittent     Equipment Recommendations  Other (comment) (pt plans to use 4 wheel RW at home)    Recommendations for Other Services OT consult     Precautions / Restrictions Precautions Precautions: None Restrictions Weight Bearing Restrictions: Yes RLE Weight Bearing: Weight bearing as tolerated    Mobility  Bed Mobility Overal bed mobility: Needs Assistance Bed Mobility: Supine to Sit     Supine to sit: Min assist     General bed mobility comments: pt received standing  Transfers Overall transfer level: Needs assistance Equipment used: Rolling walker (2 wheeled) Transfers: Sit to/from Stand Sit to Stand: Modified independent (Device/Increase time)         General transfer comment: pt stood safely with proper hand placement  Ambulation/Gait Ambulation/Gait assistance: Supervision Ambulation Distance (Feet): 200 Feet Assistive device: Rolling walker (2 wheeled) Gait Pattern/deviations: Step-through pattern;Antalgic;Decreased weight shift to right Gait velocity: decreased Gait velocity interpretation: Below normal speed for age/gender General Gait Details: pt with more fluid gait as he increases distance   Stairs Stairs: Yes Stairs assistance: Min assist Stair Management: No rails;Backwards;With walker Number of Stairs: 3 General stair comments: pt's wife educated on how to assist pt on steps bkwds with RW and she and  husband practiced together  Wheelchair Mobility    Modified Rankin (Stroke Patients Only)       Balance Overall balance assessment: Needs assistance   Sitting balance-Leahy Scale: Normal     Standing balance support: Bilateral upper extremity supported;During functional activity Standing balance-Leahy Scale: Good Standing balance comment: hand on wall for support to stand to urinate                    Cognition Arousal/Alertness: Awake/alert Behavior During Therapy: WFL for tasks assessed/performed Overall Cognitive Status: Within Functional Limits for tasks assessed                      Exercises Total Joint Exercises Ankle Circles/Pumps: AROM;Both;20 reps;Seated Quad Sets: AROM;Both;10 reps;Seated Hip ABduction/ADduction: AAROM;Right;10 reps;Seated Straight Leg Raises: AAROM;Right;10 reps;Seated Long Arc Quad: AAROM;Right;10 reps;Seated Marching in Standing: AROM;Right;10 reps;Standing Standing Hip Extension: AROM;Right;10 reps;Standing    General Comments General comments (skin integrity, edema, etc.): discussed car transfer and other d/c issues      Pertinent Vitals/Pain Pain Assessment: 0-10 Pain Score: 6  Pain Location: right hip Pain Descriptors / Indicators: Aching Pain Intervention(s): Monitored during session;Premedicated before session    Home Living Family/patient expects to be discharged to:: Private residence Living Arrangements: Spouse/significant other Available Help at Discharge: Family;Friend(s);Available PRN/intermittently Type of Home: House Home Access: Stairs to enter Entrance Stairs-Rails: None Home Layout: Two level;1/2 bath on main level Home Equipment: Walker - 4 wheels;Shower seat Additional Comments: pt's wife will be back at work tomorrow but friends and familt will be checking on him. His bedroom is on main level but shower is upstairs (his bedroom has tub only). Pt  has tub bench he uses downstairs. Has walkin shower  upstairs.    Prior Function Level of Independence: Independent      Comments: works for [employer], climbs telephone poles, crawls under houses   PT Goals (current goals can now be found in the care plan section) Acute Rehab PT Goals Patient Stated Goal: return home PT Goal Formulation: With patient Time For Goal Achievement: 07/26/15 Potential to Achieve Goals: Good Progress towards PT goals: Progressing toward goals    Frequency  BID    PT Plan Current plan remains appropriate    Co-evaluation             End of Session Equipment Utilized During Treatment: Gait belt Activity Tolerance: Patient tolerated treatment well Patient left: in chair;with call bell/phone within reach     Time: 1022-1047 PT Time Calculation (min) (ACUTE ONLY): 25 min  Charges:  $Gait Training: 23-37 mins                    G Codes:     Micheal Foster, PT  Acute Rehab Services  [phone]  Micheal Foster, Micheal Foster 07/19/2015, 1:39 PM

## 2015-07-19 NOTE — Discharge Summary (Signed)
Physician Discharge Summary  Patient ID: Micheal Foster MRN: [id number] DOB/AGE: 59-23-1957 59 y.o.  Admit date: 07/18/2015 Discharge date: 07/19/2015  Admission Diagnoses:  Primary osteoarthritis of right hip  Discharge Diagnoses:  Principal Problem:   Primary osteoarthritis of right hip Active Problems:   DJD (degenerative joint disease)   Chronic alcohol dependence, continuous (HCC)   Past Medical History  Diagnosis Date  . Arthritis   . Chronic foot pain     Surgeries: Procedure(s): TOTAL HIP ARTHROPLASTY ANTERIOR APPROACH on 07/18/2015   Consultants (if any):    Discharged Condition: Improved  Hospital Course: Micheal Foster is an 59 y.o. male who was admitted 07/18/2015 with a diagnosis of Primary osteoarthritis of right hip and went to the operating room on 07/18/2015 and underwent the above named procedures.    He was given perioperative antibiotics:  Anti-infectives    Start     Dose/Rate Route Frequency Ordered Stop   07/18/15 1845  ceFAZolin (ANCEF) IVPB 2 g/50 mL premix     2 g 100 mL/hr over 30 Minutes Intravenous Every 6 hours 07/18/15 1843 07/19/15 0644   07/18/15 1100  vancomycin (VANCOCIN) 1,500 mg in sodium chloride 0.9 % 500 mL IVPB     1,500 mg 250 mL/hr over 120 Minutes Intravenous To ShortStay [facility] 07/17/15 1332 07/18/15 1250    .  He was given sequential compression devices, early ambulation, and ASA   for DVT prophylaxis.  He benefited maximally from the hospital stay and there were no complications.    Recent vital signs:  Filed Vitals:   07/19/15 0615  BP: 131/65  Pulse: 78  Temp: 98.6 F (37 C)  Resp: 16    Recent laboratory studies:  Lab Results  Component Value Date   HGB 14.6 07/07/2015   Lab Results  Component Value Date   WBC 7.7 07/07/2015   PLT 182 07/07/2015   Lab Results  Component Value Date   INR 1.05 07/07/2015   Lab Results  Component Value Date   NA 135 07/07/2015   K 3.6 07/07/2015   CL 98*  07/07/2015   CO2 28 07/07/2015   BUN 13 07/07/2015   CREATININE 1.21 07/07/2015   GLUCOSE 91 07/07/2015    Discharge Medications:     Medication List    STOP taking these medications        ibuprofen 200 MG tablet  Commonly known as:  ADVIL,MOTRIN      TAKE these medications        aspirin 325 MG tablet  Take 1 tablet (325 mg total) by mouth daily.     diphenhydrAMINE 50 MG capsule  Commonly known as:  BENADRYL  Take 50 mg by mouth at bedtime as needed for sleep.     docusate sodium 100 MG capsule  Commonly known as:  COLACE  Take 1 capsule (100 mg total) by mouth 2 (two) times daily.     Fish Oil 1200 MG Caps  Take 1 capsule by mouth 2 (two) times daily.     HYDROcodone-acetaminophen 10-325 MG tablet  Commonly known as:  NORCO  Take 1 tablet by mouth every 6 (six) hours as needed for moderate pain.     HYDROmorphone 4 MG tablet  Commonly known as:  DILAUDID  Take 4 mg by mouth every 4 (four) hours as needed for severe pain.     lisinopril-hydrochlorothiazide 20-25 MG tablet  Commonly known as:  PRINZIDE,ZESTORETIC  Take 1 tablet by mouth daily.  methocarbamol 500 MG tablet  Commonly known as:  ROBAXIN  Take 1 tablet (500 mg total) by mouth 4 (four) times daily.     ondansetron 4 MG tablet  Commonly known as:  ZOFRAN  Take 1 tablet (4 mg total) by mouth every 8 (eight) hours as needed for nausea or vomiting.        Diagnostic Studies: Dg Hip Operative Unilat With Pelvis Right  07/18/2015  CLINICAL DATA:  Right anterior total hip arthroplasty EXAM: OPERATIVE RIGHT HIP (WITH PELVIS IF PERFORMED) 2 VIEWS TECHNIQUE: Fluoroscopic spot image(s) were submitted for interpretation post-operatively. COMPARISON:  None FINDINGS: The patient has undergone right total hip arthroplasty. The femoral head component is well seated in the acetabular component on the frontal views provided. No new fracture identified. IMPRESSION: No adverse features following right total hip  arthroplasty. Electronically Signed   By: Norva PavlovElizabeth  Brown M.D.   On: 07/18/2015 14:31    Disposition: Final discharge disposition not confirmed      Discharge Instructions    Weight bearing as tolerated    Complete by:  As directed   Laterality:  right  Extremity:  Lower           Follow-up Information    Follow up with MURPHY, TIMOTHY D, MD In 10 days.   Specialty:  Orthopedic Surgery   Contact information:   [address] [place] [id number] [phone]        Signed: Lynann BolognaKelly,Kin Galbraith Marie 07/19/2015, 7:41 AM Cell [phone]

## 2015-07-19 NOTE — Clinical Social Work Note (Deleted)
CSW received referral for SNF.  Case discussed with case manager and plan is to discharge home.  CSW to sign off please re-consult if social work needs arise.  Amarya Kuehl R. Gwendolin Briel, MSW, LCSWA [phone]  

## 2015-07-19 NOTE — Evaluation (Signed)
Physical Therapy Evaluation Patient Details Name: Micheal Foster MRN: [id number] DOB: 1956/05/10 Today's Date: 07/19/2015   History of Present Illness  Pt presents for right direct anterior THA with PMH of OA, ETOH abuse, chronic foot pain  Clinical Impression  Pt is s/p right THA resulting in the deficits listed below (see PT Problem List). Pt ambulated 100' with min guard A and RW on eval.  Pt will benefit from skilled PT to increase their independence and safety with mobility to allow discharge to the venue listed below.      Follow Up Recommendations Home health PT;Supervision - Intermittent    Equipment Recommendations  Rolling walker with 5" wheels    Recommendations for Other Services OT consult     Precautions / Restrictions Precautions Precautions: None Restrictions Weight Bearing Restrictions: Yes RLE Weight Bearing: Weight bearing as tolerated      Mobility  Bed Mobility Overal bed mobility: Needs Assistance Bed Mobility: Supine to Sit     Supine to sit: Min assist     General bed mobility comments: vc's for sequencing and min A to right leg as well as trunk  Transfers Overall transfer level: Needs assistance Equipment used: Rolling walker (2 wheeled) Transfers: Sit to/from Stand Sit to Stand: Supervision         General transfer comment: vc's for hand placement with standing and sitting  Ambulation/Gait Ambulation/Gait assistance: Min guard Ambulation Distance (Feet): 100 Feet Assistive device: Rolling walker (2 wheeled) Gait Pattern/deviations: Step-through pattern;Decreased stance time - right;Decreased weight shift to right Gait velocity: decreased Gait velocity interpretation: Below normal speed for age/gender General Gait Details: pt had increased pain with lifting right LE, encouraged him not to drag it. Pt most comfortable with right hip and foot in external rotation but encouraged him to correct at least partially not that he has had  surgery  Stairs            Wheelchair Mobility    Modified Rankin (Stroke Patients Only)       Balance Overall balance assessment: No apparent balance deficits (not formally assessed) Sitting-balance support: No upper extremity supported Sitting balance-Leahy Scale: Normal     Standing balance support: No upper extremity supported Standing balance-Leahy Scale: Good Standing balance comment: pt able to stand at sink for washing face and bruching teeth with no balance loss                             Pertinent Vitals/Pain Pain Assessment: 0-10 Pain Score: 10-Worst pain ever Pain Location: right hip Pain Descriptors / Indicators: Aching Pain Intervention(s): Limited activity within patient's tolerance;Premedicated before session;Monitored during session    Home Living Family/patient expects to be discharged to:: Private residence Living Arrangements: Spouse/significant other Available Help at Discharge: Family;Friend(s);Available PRN/intermittently Type of Home: House Home Access: Stairs to enter Entrance Stairs-Rails: None Entrance Stairs-Number of Steps: 4 Home Layout: Two level;1/2 bath on main level Home Equipment: Walker - 4 wheels;Shower seat Additional Comments: pt's wife will be back at work tomorrow but friends and familt will be checking on him. 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     Cervical / Trunk Assessment: Normal  Communication   Communication: No difficulties  Cognition Arousal/Alertness:  Awake/alert Behavior During Therapy: WFL for tasks assessed/performed Overall Cognitive Status: Within Functional Limits for tasks assessed                      General Comments      Exercises Total Joint Exercises Ankle Circles/Pumps: AROM;Both;20 reps;Seated Quad Sets: AROM;Both;10 reps;Seated Gluteal Sets: AROM;Both;10 reps;Seated Long Arc Quad: AAROM;Right;10 reps;Seated      Assessment/Plan    PT Assessment Patient needs continued PT services  PT Diagnosis Difficulty walking;Abnormality of gait;Acute pain   PT Problem List Decreased strength;Decreased range of motion;Decreased activity tolerance;Decreased mobility;Decreased knowledge of use of DME;Decreased knowledge of precautions;Pain  PT Treatment Interventions DME instruction;Gait training;Functional mobility training;Stair training;Therapeutic activities;Therapeutic exercise;Patient/family education   PT Goals (Current goals can be found in the Care Plan section) Acute Rehab PT Goals Patient Stated Goal: return home PT Goal Formulation: With patient Time For Goal Achievement: 07/26/15 Potential to Achieve Goals: Good    Frequency BID   Barriers to discharge        Co-evaluation               End of Session Equipment Utilized During Treatment: Gait belt Activity Tolerance: Patient tolerated treatment well Patient left: in chair;with call bell/phone within reach Nurse Communication: Mobility status         Time: 0810-0848 PT Time Calculation (min) (ACUTE ONLY): 38 min   Charges:   PT Evaluation $Initial PT Evaluation Tier I: 1 Procedure PT Treatments $Gait Training: 8-22 mins $Therapeutic Activity: 8-22 mins   PT G Codes:       Lyanne CoVictoria Yazeed Pryer, PT  Acute Rehab Services  [phone]  Lyanne CoManess, Ellanie Oppedisano 07/19/2015, 9:01 AM

## 2015-07-19 NOTE — Progress Notes (Signed)
Utilization Review Completed.Micheal Foster T1/19/2016  

## 2015-07-19 NOTE — Care Management (Signed)
Case manager contacted [employer] rep, Kipp BroodBrent, informed him that Mr. Micheal Foster is refusing the walker, states he has a rollator at home and therapy has agreed that he can use that. Patient will not take walker home and wants charge removed . CM spoke with Kipp BroodBrent concerning this. Rolling walker will be picked up by Kipp BroodBrent on Thursday.

## 2015-07-19 NOTE — Progress Notes (Signed)
     Subjective:  POD#1 RATH.  Patient reports pain as mild to moderate.  Resting comfortably in bed this morning.  Patient was able to get out of bed last night with some assistance.  Will see how he does with PT today, but suspect that he will be good for discharge to home later today.  PO dilaudid made the patient very lethargic last night.  Recommending to start with Norco and Robaxin and see how he does.  If pain is too much, then will try 1/2 tablet of the PO dilaudid.  Longstanding alcohol consumption.  CIWA in place.   Objective:   VITALS:   Filed Vitals:   07/18/15 1820 07/18/15 2030 07/19/15 0145 07/19/15 0615  BP: 145/72 105/50 152/86 131/65  Pulse: 83 88 95 78  Temp: 98 F (36.7 C) 98.5 F (36.9 C) 98.4 F (36.9 C) 98.6 F (37 C)  TempSrc:  Oral Oral Oral  Resp: 16 16 16 16   Height:      Weight:      SpO2: 97% 97% 95% 98%    Neurologically intact ABD soft Neurovascular intact Sensation intact distally Intact pulses distally Dorsiflexion/Plantar flexion intact Incision: dressing C/D/I   Lab Results  Component Value Date   WBC 7.7 07/07/2015   HGB 14.6 07/07/2015   HCT 44.0 07/07/2015   MCV 95.7 07/07/2015   PLT 182 07/07/2015   BMET    Component Value Date/Time   NA 135 07/07/2015 1359   K 3.6 07/07/2015 1359   CL 98* 07/07/2015 1359   CO2 28 07/07/2015 1359   GLUCOSE 91 07/07/2015 1359   BUN 13 07/07/2015 1359   CREATININE 1.21 07/07/2015 1359   CALCIUM 9.0 07/07/2015 1359   GFRNONAA >60 07/07/2015 1359   GFRAA >60 07/07/2015 1359     Assessment/Plan: 1 Day Post-Op   Principal Problem:   Primary osteoarthritis of right hip Active Problems:   DJD (degenerative joint disease)   Chronic alcohol dependence, continuous (HCC)   Up with therapy  WBAT in the RLE ASA 325mg  for DVT prophylaxis Plan is for discharge home this afternoon after two sessions with PT.   Lynann Foster,Micheal [place] Marie 07/19/2015, 7:37 AM Cell [phone]

## 2015-07-21 NOTE — Care Management Note (Signed)
Case Management Note  Patient Details  Name: Micheal Foster MRN: [id number] Date of Birth: 10/05/1955  Subjective/Objective:                    Action/Plan: Patient had discharged prior to CM [place] HH [place]. Candie ChromanContacted Gordon at [facility] [phone],  faxed orders, H & P, OP note to him at [phone]. Roger ShelterGordon stated they will plan start of care on today, 07/21/15.   In-House Referral:     Discharge planning Services  CM Consult  Post Acute Care Choice:  Home Health Choice offered to:     DME Arranged:    DME Agency:     HH Arranged:  PT HH Agency:  [facility]  Status of Service:  Completed, signed off  Medicare Important Message Given:    Date Medicare IM Given:    Medicare IM give by:    Date Additional Medicare IM Given:    Additional Medicare Important Message give by:     If discussed at Long Length of Stay Meetings, dates discussed:    Additional Comments:  Durenda GuthrieBrady, Hayle Parisi Naomi, RN 07/21/2015, 10:22 AM

## 2017-03-29 IMAGING — RF DG HIP (WITH PELVIS) OPERATIVE*R*
1 series · 2 of 2 positions shown · non-contrast
Comparison: None

CLINICAL DATA: Right anterior total hip arthroplasty

EXAM:
OPERATIVE RIGHT HIP (WITH PELVIS IF PERFORMED) 2 VIEWS
TECHNIQUE: Fluoroscopic spot image(s) were submitted for interpretation
post-operatively.

[Series 1: run · 2 of 2 slices shown]
[im 1/2]
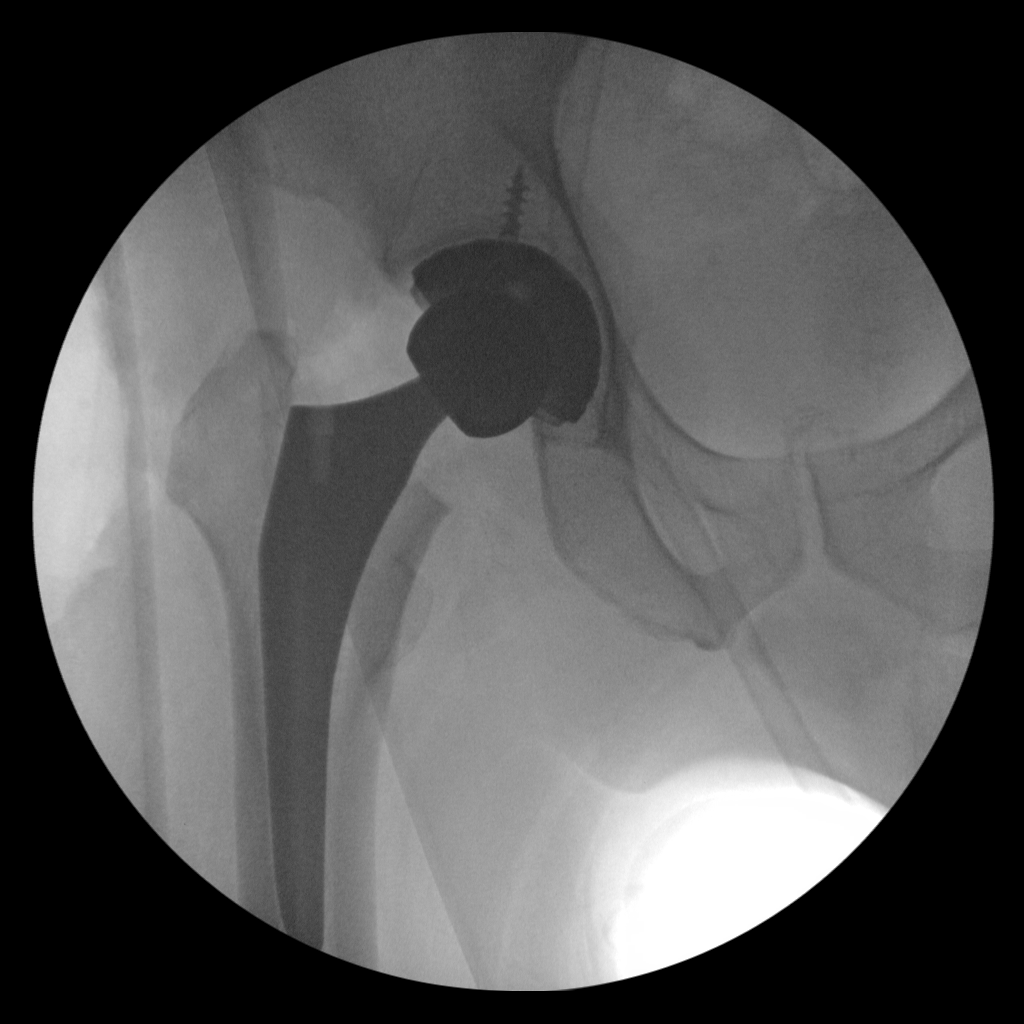
[im 2/2]
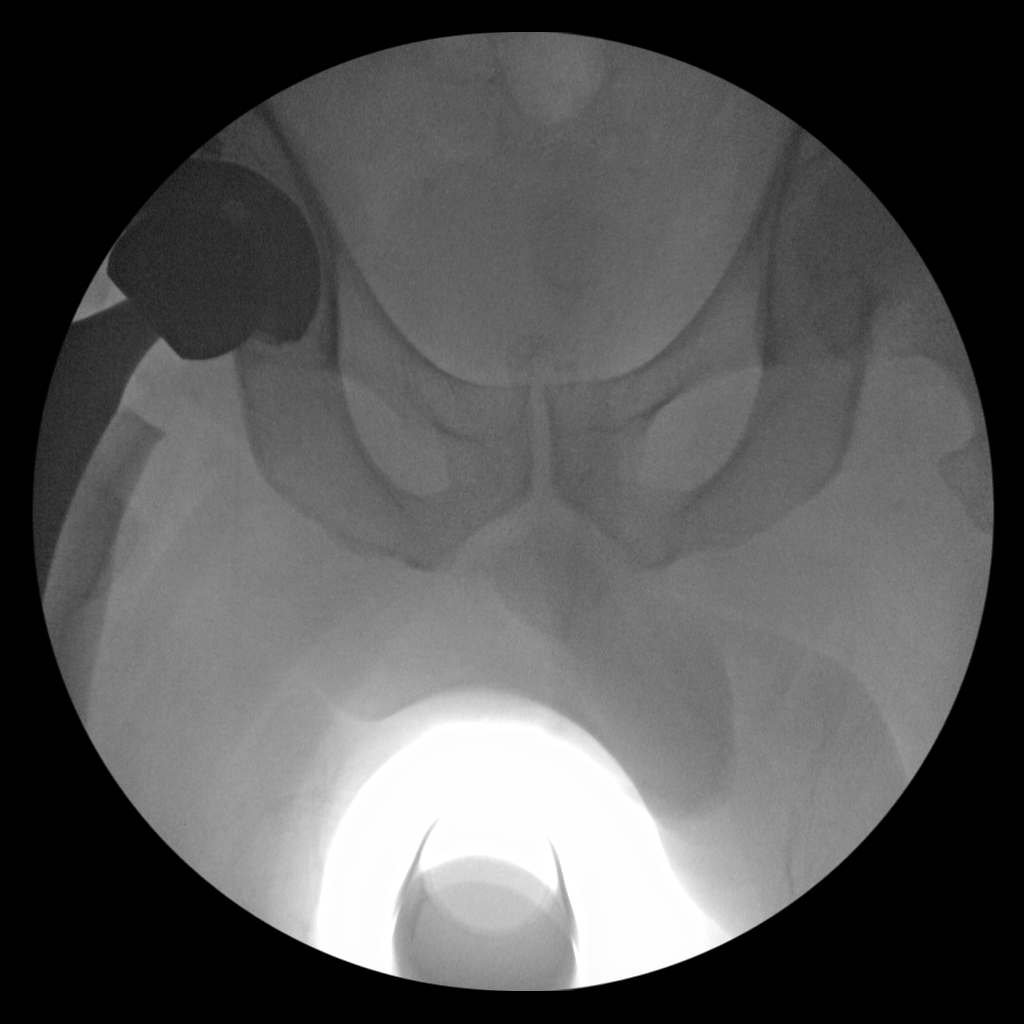

[2 of 2 positions shown; findings below may reference images not displayed]

FINDINGS: The patient has undergone right total hip arthroplasty. The femoral
head component is well seated in the acetabular component on the
frontal views provided. No new fracture identified.
IMPRESSION: No adverse features following right total hip arthroplasty.

## 2017-07-11 ENCOUNTER — Ambulatory Visit (INDEPENDENT_AMBULATORY_CARE_PROVIDER_SITE_OTHER): Payer: BLUE CROSS/BLUE SHIELD | Admitting: Orthopaedic Surgery

## 2017-07-11 ENCOUNTER — Encounter (INDEPENDENT_AMBULATORY_CARE_PROVIDER_SITE_OTHER): Payer: Self-pay | Admitting: Orthopaedic Surgery

## 2017-07-11 DIAGNOSIS — M79672 Pain in left foot: Secondary | ICD-10-CM | POA: Diagnosis not present

## 2017-07-11 NOTE — Progress Notes (Signed)
   Office Visit Note   Patient: Micheal Foster           Date of Birth: 01/28/1956           MRN: [id number] Visit Date: 07/11/2017              Requested by: No referring provider defined for this encounter. PCP: Donnita Fallsodriguez, Wendy, MD   Assessment & Plan: Visit Diagnoses:  1. Left foot pain     Plan: Impression is left fourth metatarsal head pain from retracted fat pad.  Prescription for metatarsal bar was prescribed today.  Questions encouraged and answered.  Follow-up as needed.  Follow-Up Instructions: Return if symptoms worsen or fail to improve.   Orders:  No orders of the defined types were placed in this encounter.  No orders of the defined types were placed in this encounter.     Procedures: No procedures performed   Clinical Data: No additional findings.   Subjective: Chief Complaint  Patient presents with  . Left Foot - Pain    Patient is a 61 year old gentleman who comes in with pain under the fourth metatarsal head.  He has worn a pad which does help some.  The pain is worse with weightbearing.  Denies any numbness and tingling or injuries.    Review of Systems  Constitutional: Negative.   All other systems reviewed and are negative.    Objective: Vital Signs: There were no vitals taken for this visit.  Physical Exam  Constitutional: He is oriented to person, place, and time. He appears well-developed and well-nourished.  Pulmonary/Chest: Effort normal.  Abdominal: Soft.  Neurological: He is alert and oriented to person, place, and time.  Skin: Skin is warm.  Psychiatric: He has a normal mood and affect. His behavior is normal. Judgment and thought content normal.  Nursing note and vitals reviewed.   Ortho Exam Left foot exam shows high arches with fixed hammertoe deformities.  He does have a plantar callus under the metatarsal heads.  Fourth metatarsal is tender.  Webspace is nontender.  Intermetatarsal spaces are nontender.  Negative  Mulder's click. Specialty Comments:  No specialty comments available.  Imaging: No results found.   PMFS History: Patient Active Problem List   Diagnosis Date Noted  . DJD (degenerative joint disease) 07/18/2015  . Primary osteoarthritis of right hip 07/18/2015  . Chronic alcohol dependence, continuous (HCC) 07/18/2015   Past Medical History:  Diagnosis Date  . Arthritis   . Chronic foot pain     No family history on file.  Past Surgical History:  Procedure Laterality Date  . HERNIA REPAIR     times 2  . TOTAL HIP ARTHROPLASTY Right 07/18/2015   Procedure: TOTAL HIP ARTHROPLASTY ANTERIOR APPROACH;  Surgeon: Sheral Apleyimothy D Murphy, MD;  Location: [facility] OR;  Service: Orthopedics;  Laterality: Right;   Social History   Occupational History  . Not on file.   Social History Main Topics  . Smoking status: Never Smoker  . Smokeless tobacco: Never Used  . Alcohol use 3.6 oz/week    6 Cans of beer per week  . Drug use: Yes     Comment: marijuana  last time 8 yrs ago       cocaine many years ago  . Sexual activity: Not on file

## 2020-11-16 ENCOUNTER — Encounter: Payer: Self-pay | Admitting: Internal Medicine

## 2020-12-25 ENCOUNTER — Ambulatory Visit: Payer: Medicare HMO | Admitting: Gastroenterology

## 2021-03-21 ENCOUNTER — Ambulatory Visit: Payer: Medicare HMO | Admitting: Gastroenterology

## 2023-07-30 ENCOUNTER — Encounter (HOSPITAL_COMMUNITY): Payer: Medicare HMO

## 2023-08-12 ENCOUNTER — Ambulatory Visit (HOSPITAL_COMMUNITY): Admit: 2023-08-12 | Payer: Medicare HMO | Admitting: Orthopedic Surgery

## 2023-08-12 SURGERY — ARTHROPLASTY, KNEE, TOTAL
Anesthesia: Spinal | Site: Knee | Laterality: Left

## 2023-09-09 NOTE — Progress Notes (Signed)
 Sent message, via epic in basket, requesting orders in epic from Careers adviser.

## 2023-09-15 NOTE — Progress Notes (Signed)
 Second request for pre op orders in [facility]: Left voicemail for [employer]

## 2023-09-15 NOTE — Progress Notes (Signed)
 Anesthesia Review:  PCP: Cardiologist : Chest x-ray : EKG : Echo : Stress test: Cardiac Cath :  Activity level:  Sleep Study/ CPAP : Fasting Blood Sugar :      / Checks Blood Sugar -- times a day:   Blood Thinner/ Instructions /Last Dose: ASA / Instructions/ Last Dose :    08/12/23- surgery cancelled

## 2023-09-15 NOTE — Patient Instructions (Addendum)
 SURGICAL WAITING ROOM VISITATION  Patients having surgery or a procedure may have no more than 2 support people in the waiting area - these visitors may rotate.    Children under the age of 71 must have an adult with them who is not the patient.  Due to an increase in RSV and influenza rates and associated hospitalizations, children ages 68 and under may not visit patients in [facility] hospitals.  If the patient needs to stay at the hospital during part of their recovery, the visitor guidelines for inpatient rooms apply. Pre-op nurse will coordinate an appropriate time for 1 support person to accompany patient in pre-op.  This support person may not rotate.    Please refer to the [facility] website for the visitor guidelines for Inpatients (after your surgery is over and you are in a regular room).       Your procedure is scheduled on:  09/30/23    Report to [facility] Main Entrance    Report to admitting at  0745 AM   Call this number if you have problems the morning of surgery [phone]   Do not eat food :After Midnight.   After Midnight you may have the following liquids until _ 0715_____ AM DAY OF SURGERY  Water Non-Citrus Juices (without pulp, NO RED-Apple, White grape, White cranberry) Black Coffee (NO MILK/CREAM OR CREAMERS, sugar ok)  Clear Tea (NO MILK/CREAM OR CREAMERS, sugar ok) regular and decaf                             Plain Jell-O (NO RED)                                           Fruit ices (not with fruit pulp, NO RED)                                     Popsicles (NO RED)                                                               Sports drinks like Gatorade (NO RED)                               If you have questions, please contact your surgeon's office.      Oral Hygiene is also important to reduce your risk of infection.                                    Remember - BRUSH YOUR TEETH THE MORNING OF SURGERY WITH YOUR REGULAR  TOOTHPASTE  DENTURES WILL BE REMOVED PRIOR TO SURGERY PLEASE DO NOT APPLY Poly grip OR ADHESIVES!!!   Do NOT smoke after Midnight   Stop all vitamins and herbal supplements 7 days before surgery.   Take these medicines the morning of surgery with A SIP OF WATER:  amlodipine, toprol   DO NOT TAKE  ANY ORAL DIABETIC MEDICATIONS DAY OF YOUR SURGERY  Bring CPAP mask and tubing day of surgery.                              You may not have any metal on your body including hair pins, jewelry, and body piercing             Do not wear make-up, lotions, powders, perfumes/cologne, or deodorant  Do not wear nail polish including gel and S&S, artificial/acrylic nails, or any other type of covering on natural nails including finger and toenails. If you have artificial nails, gel coating, etc. that needs to be removed by a nail salon please have this removed prior to surgery or surgery may need to be canceled/ delayed if the surgeon/ anesthesia feels like they are unable to be safely monitored.   Do not shave  48 hours prior to surgery.               Men may shave face and neck.   Do not bring valuables to the hospital. [place] IS NOT             RESPONSIBLE   FOR VALUABLES.   Contacts, glasses, dentures or bridgework may not be worn into surgery.   Bring small overnight bag day of surgery.   DO NOT BRING YOUR HOME MEDICATIONS TO THE HOSPITAL. PHARMACY WILL DISPENSE MEDICATIONS LISTED ON YOUR MEDICATION LIST TO YOU DURING YOUR ADMISSION IN THE HOSPITAL!    Patients discharged on the day of surgery will not be allowed to drive home.  Someone NEEDS to stay with you for the first 24 hours after anesthesia.   Special Instructions: Bring a copy of your healthcare power of attorney and living will documents the day of surgery if you haven't scanned them before.              Please read over the following fact sheets you were given: IF YOU HAVE QUESTIONS ABOUT YOUR PRE-OP INSTRUCTIONS PLEASE  CALL [phone]   If you received a COVID test during your pre-op visit  it is requested that you wear a mask when out in public, stay away from anyone that may not be feeling well and notify your surgeon if you develop symptoms. If you test positive for Covid or have been in contact with anyone that has tested positive in the last 10 days please notify you surgeon.      Pre-operative 5 CHG Bath Instructions   You can play a key role in reducing the risk of infection after surgery. Your skin needs to be as free of germs as possible. You can reduce the number of germs on your skin by washing with CHG (chlorhexidine  gluconate) soap before surgery. CHG is an antiseptic soap that kills germs and continues to kill germs even after washing.   DO NOT use if you have an allergy to chlorhexidine /CHG or antibacterial soaps. If your skin becomes reddened or irritated, stop using the CHG and notify one of our RNs at [phone].   Please shower with the CHG soap starting 4 days before surgery using the following schedule:     Please keep in mind the following:  DO NOT shave, including legs and underarms, starting the day of your first shower.   You may shave your face at any point before/day of surgery.  Place clean sheets on your bed the day you start using CHG  soap. Use a clean washcloth (not used since being washed) for each shower. DO NOT sleep with pets once you start using the CHG.   CHG Shower Instructions:  If you choose to wash your hair and private area, wash first with your normal shampoo/soap.  After you use shampoo/soap, rinse your hair and body thoroughly to remove shampoo/soap residue.  Turn the water OFF and apply about 3 tablespoons (45 ml) of CHG soap to a CLEAN washcloth.  Apply CHG soap ONLY FROM YOUR NECK DOWN TO YOUR TOES (washing for 3-5 minutes)  DO NOT use CHG soap on face, private areas, open wounds, or sores.  Pay special attention to the area where your surgery is  being performed.  If you are having back surgery, having someone wash your back for you may be helpful. Wait 2 minutes after CHG soap is applied, then you may rinse off the CHG soap.  Pat dry with a clean towel  Put on clean clothes/pajamas   If you choose to wear lotion, please use ONLY the CHG-compatible lotions on the back of this paper.     Additional instructions for the day of surgery: DO NOT APPLY any lotions, deodorants, cologne, or perfumes.   Put on clean/comfortable clothes.  Brush your teeth.  Ask your nurse before applying any prescription medications to the skin.      CHG Compatible Lotions   Aveeno Moisturizing lotion  Cetaphil Moisturizing Cream  Cetaphil Moisturizing Lotion  Clairol Herbal Essence Moisturizing Lotion, Dry Skin  Clairol Herbal Essence Moisturizing Lotion, Extra Dry Skin  Clairol Herbal Essence Moisturizing Lotion, Normal Skin  Curel Age Defying Therapeutic Moisturizing Lotion with Alpha Hydroxy  Curel Extreme Care Body Lotion  Curel Soothing Hands Moisturizing Hand Lotion  Curel Therapeutic Moisturizing Cream, Fragrance-Free  Curel Therapeutic Moisturizing Lotion, Fragrance-Free  Curel Therapeutic Moisturizing Lotion, Original Formula  Eucerin Daily Replenishing Lotion  Eucerin Dry Skin Therapy Plus Alpha Hydroxy Crme  Eucerin Dry Skin Therapy Plus Alpha Hydroxy Lotion  Eucerin Original Crme  Eucerin Original Lotion  Eucerin Plus Crme Eucerin Plus Lotion  Eucerin TriLipid Replenishing Lotion  Keri Anti-Bacterial Hand Lotion  Keri Deep Conditioning Original Lotion Dry Skin Formula Softly Scented  Keri Deep Conditioning Original Lotion, Fragrance Free Sensitive Skin Formula  Keri Lotion Fast Absorbing Fragrance Free Sensitive Skin Formula  Keri Lotion Fast Absorbing Softly Scented Dry Skin Formula  Keri Original Lotion  Keri Skin Renewal Lotion Keri Silky Smooth Lotion  Keri Silky Smooth Sensitive Skin Lotion  Nivea Body Creamy  Conditioning Oil  Nivea Body Extra Enriched Teacher, Adult Education Moisturizing Lotion Nivea Crme  Nivea Skin Firming Lotion  NutraDerm 30 Skin Lotion  NutraDerm Skin Lotion  NutraDerm Therapeutic Skin Cream  NutraDerm Therapeutic Skin Lotion  ProShield Protective Hand Cream  Provon moisturizing lotion

## 2023-09-16 NOTE — Progress Notes (Signed)
    I have personally never seen this patient which is why no pre-op orders have been placed yet. Patient left the office before being seen for his pre-op H&P visit on 09/15/23 due to having a panic attack. Was told his surgery would be cancelled without this visit taking place. Appointment has been rescheduled for 09/17/23 at 9:00am. If patient shows up and visit is able to be completed, then I will enter pre-op orders for him immediately following the visit since I know his PAT appointment is at 2:00pm.    Gerard CHRISTELLA Large, PA-C Office [phone] 09/16/2023, 5:19 PM

## 2023-09-17 ENCOUNTER — Encounter (HOSPITAL_COMMUNITY)
Admission: RE | Admit: 2023-09-17 | Discharge: 2023-09-17 | Disposition: A | Discharge status: Home / Self Care | Payer: Medicare HMO | Source: Ambulatory Visit | Attending: Nurse Practitioner | Admitting: Nurse Practitioner

## 2023-09-30 ENCOUNTER — Ambulatory Visit (HOSPITAL_COMMUNITY): Admit: 2023-09-30 | Payer: Medicare HMO | Admitting: Orthopedic Surgery

## 2023-09-30 SURGERY — TOTAL KNEE ARTHROPLASTY
Anesthesia: Spinal | Site: Knee | Laterality: Left
